# Patient Record
Sex: Male | Born: 2005 | Race: Black or African American | Hispanic: No | Marital: Single | Smoking: Never smoker
Health system: Southern US, Community
[De-identification: ages and names within clinical notes are randomized; demographics above are authoritative.]

## PROBLEM LIST (undated history)

## (undated) DIAGNOSIS — J45909 Unspecified asthma, uncomplicated: Secondary | ICD-10-CM

## (undated) DIAGNOSIS — Z8739 Personal history of other diseases of the musculoskeletal system and connective tissue: Secondary | ICD-10-CM

## (undated) HISTORY — DX: Personal history of other diseases of the musculoskeletal system and connective tissue: Z87.39

## (undated) HISTORY — DX: Unspecified asthma, uncomplicated: J45.909

---
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
## 2023-01-20 ENCOUNTER — Telehealth: Payer: Self-pay

## 2023-01-20 NOTE — Telephone Encounter (Signed)
LVM for patient to call back [phone], or to call PCP office to schedule follow up apt. AS, CMA  

## 2023-02-18 DIAGNOSIS — S99911D Unspecified injury of right ankle, subsequent encounter: Secondary | ICD-10-CM | POA: Diagnosis not present

## 2023-05-10 DIAGNOSIS — R238 Other skin changes: Secondary | ICD-10-CM | POA: Diagnosis not present
